# Patient Record
Sex: Female | Born: 1965 | Race: White | Hispanic: No | Marital: Married
Health system: Southern US, Community
[De-identification: ages and names within clinical notes are randomized; demographics above are authoritative.]

## PROBLEM LIST (undated history)

## (undated) DIAGNOSIS — I8000 Phlebitis and thrombophlebitis of superficial vessels of unspecified lower extremity: Secondary | ICD-10-CM

## (undated) DIAGNOSIS — I8390 Asymptomatic varicose veins of unspecified lower extremity: Secondary | ICD-10-CM

## (undated) DIAGNOSIS — N96 Recurrent pregnancy loss: Secondary | ICD-10-CM

## (undated) HISTORY — DX: Phlebitis and thrombophlebitis of superficial vessels of unspecified lower extremity: I80.00

## (undated) HISTORY — DX: Asymptomatic varicose veins of unspecified lower extremity: I83.90

## (undated) HISTORY — DX: Recurrent pregnancy loss: N96

---

## 1998-06-29 ENCOUNTER — Other Ambulatory Visit: Admission: RE | Admit: 1998-06-29 | Discharge: 1998-06-29 | Payer: Self-pay | Admitting: Gynecology

## 2002-07-20 ENCOUNTER — Other Ambulatory Visit: Admission: RE | Admit: 2002-07-20 | Discharge: 2002-07-20 | Payer: Self-pay | Admitting: Gynecology

## 2004-03-22 ENCOUNTER — Ambulatory Visit: Payer: Self-pay | Admitting: Oncology

## 2004-05-25 ENCOUNTER — Ambulatory Visit: Payer: Self-pay | Admitting: Oncology

## 2004-11-29 ENCOUNTER — Other Ambulatory Visit: Admission: RE | Admit: 2004-11-29 | Discharge: 2004-11-29 | Payer: Self-pay | Admitting: Gynecology

## 2004-12-04 ENCOUNTER — Ambulatory Visit (HOSPITAL_COMMUNITY): Admission: RE | Admit: 2004-12-04 | Discharge: 2004-12-04 | Payer: Self-pay | Admitting: Gynecology

## 2004-12-17 ENCOUNTER — Encounter: Admission: RE | Admit: 2004-12-17 | Discharge: 2004-12-17 | Payer: Self-pay | Admitting: Gynecology

## 2006-01-08 ENCOUNTER — Other Ambulatory Visit: Admission: RE | Admit: 2006-01-08 | Discharge: 2006-01-08 | Payer: Self-pay | Admitting: Obstetrics and Gynecology

## 2006-07-02 ENCOUNTER — Inpatient Hospital Stay (HOSPITAL_COMMUNITY): Admission: AD | Admit: 2006-07-02 | Discharge: 2006-07-05 | Payer: Self-pay | Admitting: Obstetrics and Gynecology

## 2007-03-23 ENCOUNTER — Ambulatory Visit (HOSPITAL_COMMUNITY): Admission: RE | Admit: 2007-03-23 | Discharge: 2007-03-23 | Payer: Self-pay | Admitting: Obstetrics and Gynecology

## 2007-05-04 ENCOUNTER — Ambulatory Visit (HOSPITAL_COMMUNITY): Admission: RE | Admit: 2007-05-04 | Discharge: 2007-05-04 | Payer: Self-pay | Admitting: Obstetrics and Gynecology

## 2008-05-30 ENCOUNTER — Ambulatory Visit (HOSPITAL_COMMUNITY): Admission: RE | Admit: 2008-05-30 | Discharge: 2008-05-30 | Payer: Self-pay | Admitting: Obstetrics and Gynecology

## 2009-06-19 ENCOUNTER — Ambulatory Visit (HOSPITAL_COMMUNITY): Admission: RE | Admit: 2009-06-19 | Discharge: 2009-06-19 | Payer: Self-pay | Admitting: Obstetrics and Gynecology

## 2010-06-18 ENCOUNTER — Other Ambulatory Visit (HOSPITAL_COMMUNITY): Payer: Self-pay | Admitting: Obstetrics and Gynecology

## 2010-06-18 DIAGNOSIS — Z1231 Encounter for screening mammogram for malignant neoplasm of breast: Secondary | ICD-10-CM

## 2010-06-27 ENCOUNTER — Ambulatory Visit (HOSPITAL_COMMUNITY)
Admission: RE | Admit: 2010-06-27 | Discharge: 2010-06-27 | Disposition: A | Discharge status: Home / Self Care | Payer: Managed Care, Other (non HMO) | Source: Ambulatory Visit | Attending: Obstetrics and Gynecology | Admitting: Obstetrics and Gynecology

## 2010-06-27 DIAGNOSIS — Z1231 Encounter for screening mammogram for malignant neoplasm of breast: Secondary | ICD-10-CM | POA: Insufficient documentation

## 2010-08-10 NOTE — Discharge Summary (Signed)
[name], [name]               ACCOUNT NO.:  [id number]   MEDICAL RECORD NO.:  [id number]          PATIENT TYPE:  INP   LOCATION:  9111                          FACILITY:  [facility]   PHYSICIAN:  Sherron Monday, MD        DATE OF BIRTH:  04-Nov-1965   DATE OF ADMISSION:  07/02/2006  DATE OF DISCHARGE:  07/05/2006                               DISCHARGE SUMMARY   ADMITTING DIAGNOSIS:  Intrauterine pregnancy at term, active labor.   DISCHARGE DIAGNOSIS:  Intrauterine pregnancy at term, delivered by  spontaneous vaginal delivery.   HISTORY OF PRESENT ILLNESS:  This 45 year old G8, P0, 0, 7, 0 at 39-6/7  weeks with an [facility] of July 04, 2006 by an 11-week ultrasound, presents  to the [facility] in active labor, 5-6 cm dilated.  She was admitted to labor  and delivery and received an epidural. She progressed rapidly to  complete in less than an hour.  She had occasional deep variables with  this rapid progression, otherwise her cervix was very reassuring.  Her  prenatal care was significant for her history of habitual abortion.  History with six previous miscarriages of unknown etiology, so she was  treated with baby ASA thrioughout her pregnancy.  She is also at  advanced maternal age but has had a normal amniocentesis.   PAST MEDICAL HISTORY:  Not significant.   PAST SURGICAL HISTORY:  Significant for D&C x5.   PAST OB/GYN HISTORY:  Rhett Bannister 1 was an ectopic pregnancy in 2002; G2- G7  from 2003 to 2006 were miscarriages. She had D&C x5.   PAST GYN HISTORY:  No recent abnormal Pap smears.   MEDICATIONS:  None.   ALLERGIES:  None.   PRENATAL LABS:  She is A positive, antibody screen negative. RPR  nonreactive. Rubella immune. Hepatitis B surface antigen negative. HIV  nonreactive. Gonorrhea/Chlamydia were negative. Group B strep negative.  One hour GTT of 90 and a normal amniocentesis revealing a 55 XY infant.   After AROM she continued to have deep variable decelerations; pushed for  approximately an hour with supervision. She had a vaginal delivery of a  vigorous female infant, with a first degree laceration.  Apgar's were 9  and 10, the weight is 7 pounds, 5 ounces.  Placenta was delivered  spontaneously and a small periurethral laceration and first degree  perineal were repaired with 3-0 Vicryl in typical fashion.   Her postpartum course was relatively uncomplicated. She remained  afebrile, vital signs were stable throughout. She was discharged home on  postpartum day two with normal lochia and her pain controlled.   She was discharged home with prescriptions for Motrin, Vicodin, and  prenatal vitamins as well as routine discharge instructions. She was  given the numbers to call with any questions or problems.  She will  continue on her baby aspirin she had been taking for the pregnancy.   DISCHARGE INFORMATION:  She is A positive, rubella immune. Her  hemoglobin decreased from 12.8 to 11.0. She plans to breast feed and is  working with the Advertising copywriter.  She is unsure of  her  contraception and this will be discussed at her postpartum check-up.      Sherron Monday, MD  Electronically Signed     JB/MEDQ  D:  07/05/2006  T:  07/05/2006  Job:  [id number]

## 2011-03-25 ENCOUNTER — Ambulatory Visit: Payer: Worker's Compensation

## 2011-04-01 ENCOUNTER — Ambulatory Visit: Payer: Worker's Compensation

## 2011-06-21 ENCOUNTER — Other Ambulatory Visit (HOSPITAL_COMMUNITY): Payer: Self-pay | Admitting: Obstetrics and Gynecology

## 2011-06-21 DIAGNOSIS — Z1231 Encounter for screening mammogram for malignant neoplasm of breast: Secondary | ICD-10-CM

## 2011-07-17 ENCOUNTER — Ambulatory Visit (HOSPITAL_COMMUNITY)
Admission: RE | Admit: 2011-07-17 | Discharge: 2011-07-17 | Disposition: A | Discharge status: Home / Self Care | Payer: Managed Care, Other (non HMO) | Source: Ambulatory Visit | Attending: Obstetrics and Gynecology | Admitting: Obstetrics and Gynecology

## 2011-07-17 DIAGNOSIS — Z1231 Encounter for screening mammogram for malignant neoplasm of breast: Secondary | ICD-10-CM

## 2011-09-16 HISTORY — PX: OTHER SURGICAL HISTORY: SHX169

## 2011-11-11 HISTORY — PX: OTHER SURGICAL HISTORY: SHX169

## 2012-06-23 ENCOUNTER — Other Ambulatory Visit (HOSPITAL_COMMUNITY): Payer: Self-pay | Admitting: Obstetrics and Gynecology

## 2012-06-23 DIAGNOSIS — Z1231 Encounter for screening mammogram for malignant neoplasm of breast: Secondary | ICD-10-CM

## 2012-07-20 ENCOUNTER — Ambulatory Visit (HOSPITAL_COMMUNITY): Payer: Managed Care, Other (non HMO)

## 2012-07-24 ENCOUNTER — Ambulatory Visit (HOSPITAL_COMMUNITY)
Admission: RE | Admit: 2012-07-24 | Discharge: 2012-07-24 | Disposition: A | Discharge status: Home / Self Care | Payer: Managed Care, Other (non HMO) | Source: Ambulatory Visit | Attending: Obstetrics and Gynecology | Admitting: Obstetrics and Gynecology

## 2012-07-24 DIAGNOSIS — Z1231 Encounter for screening mammogram for malignant neoplasm of breast: Secondary | ICD-10-CM

## 2014-03-03 IMAGING — MG MM DIGITAL SCREENING BILAT
5 series · 5 of 5 positions shown · non-contrast
Comparison: Previous exams.

CLINICAL DATA: Screening.

DIGITAL BILATERAL SCREENING MAMMOGRAM WITH CAD

[R CC (1 of 2)]
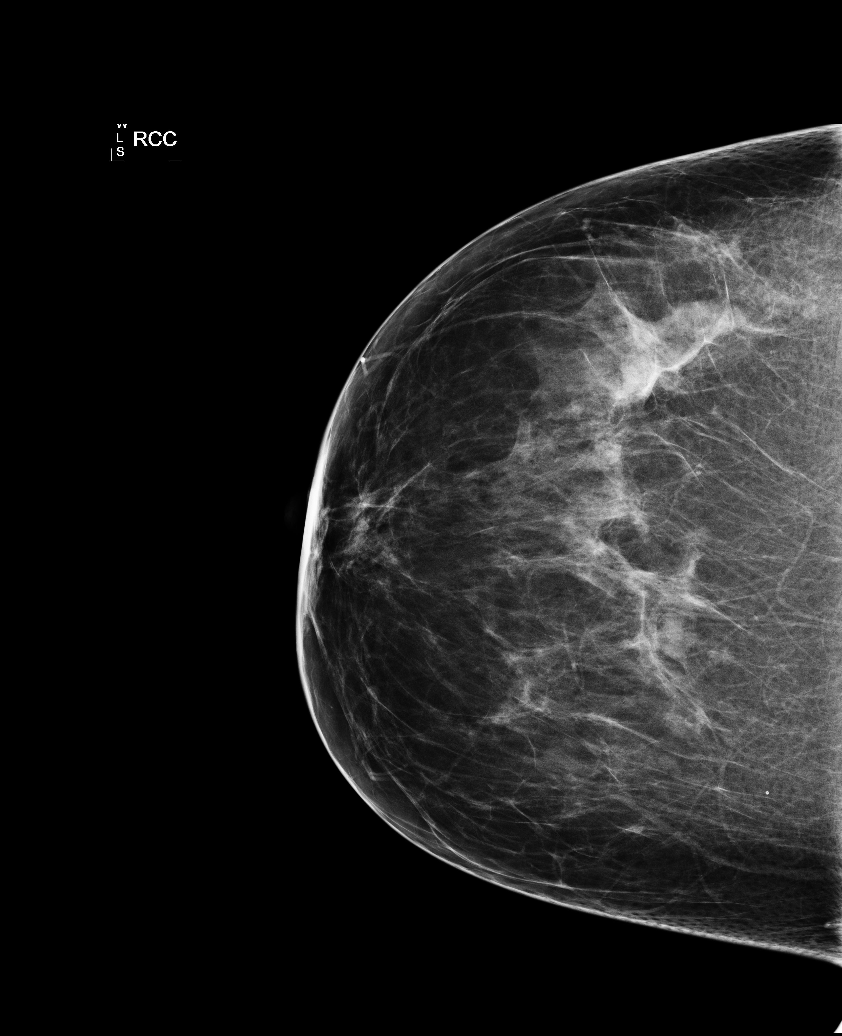

[R MLO]
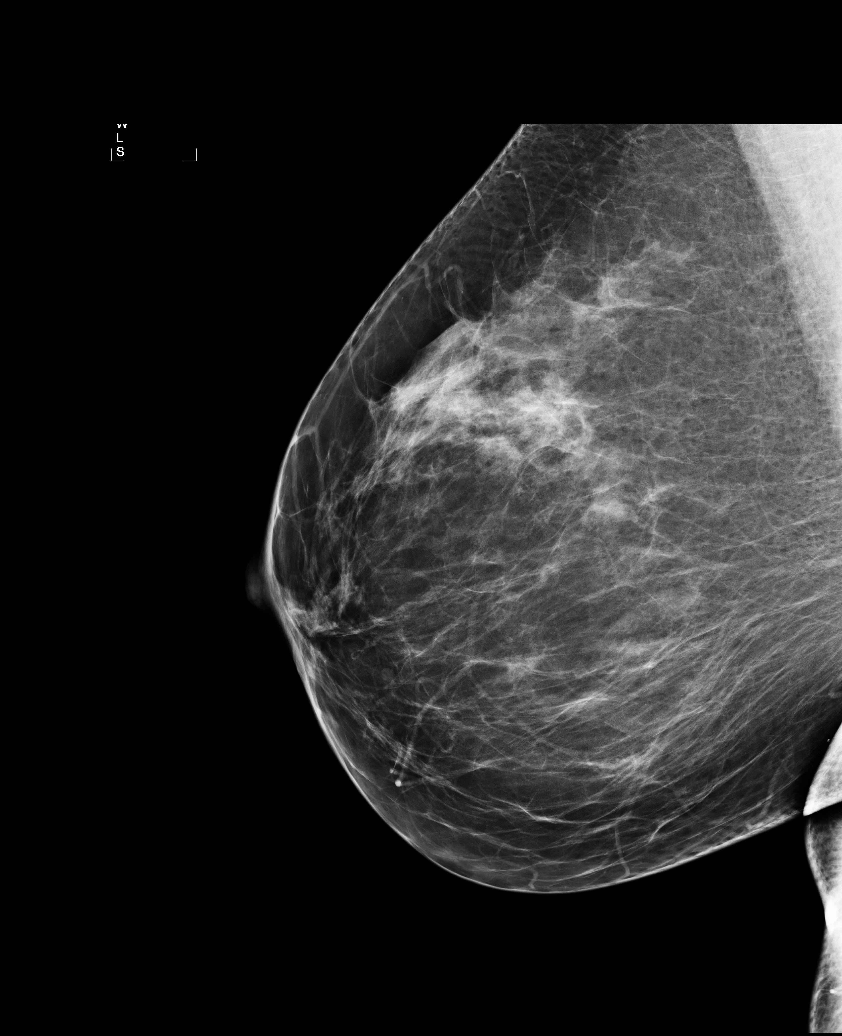

[L CC]
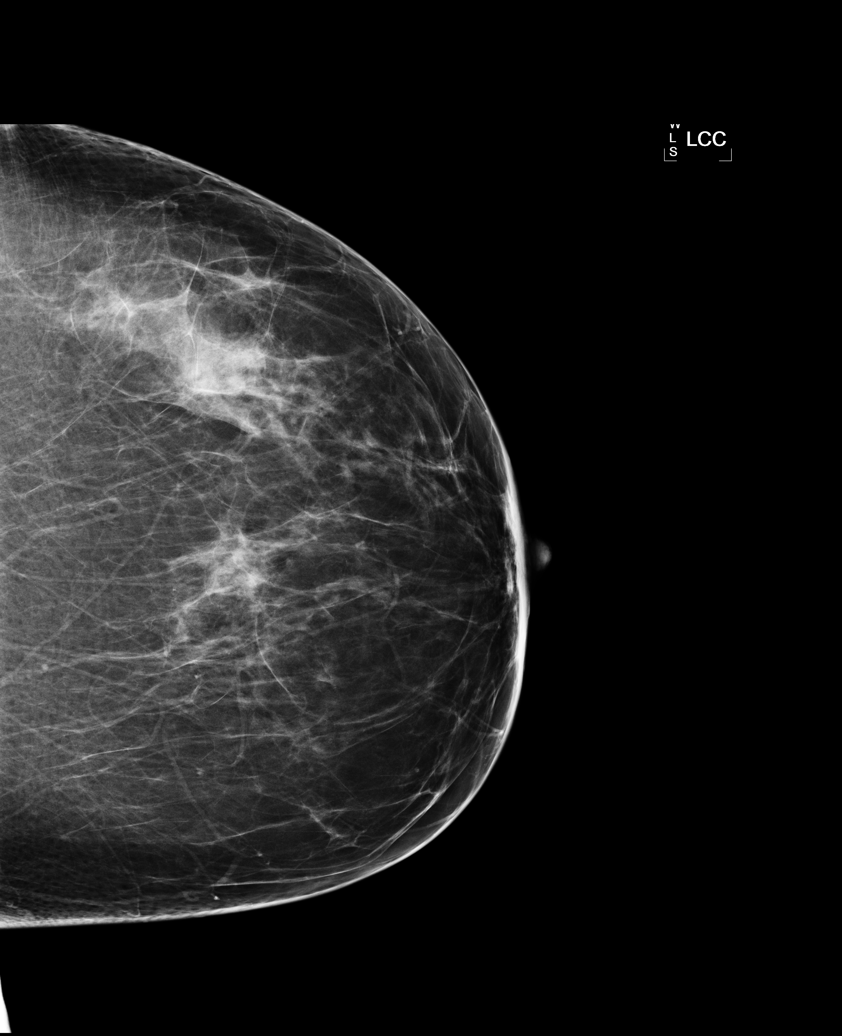

[L MLO]
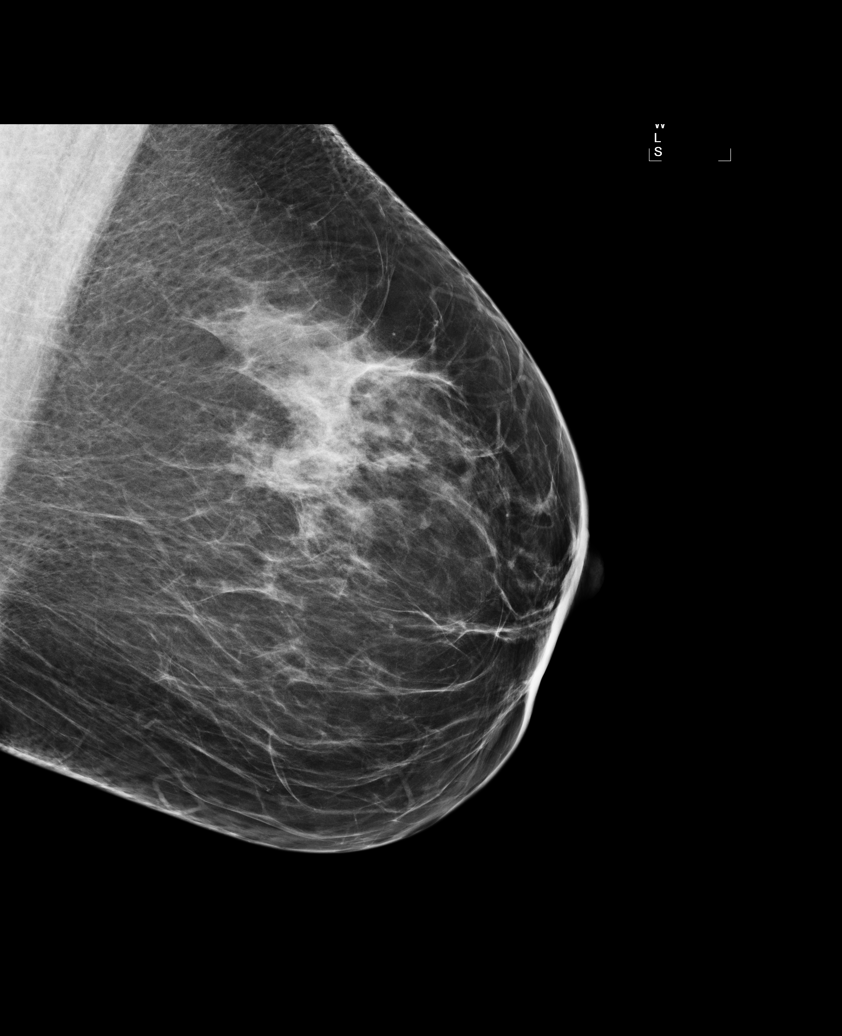

[R CC (2 of 2)]
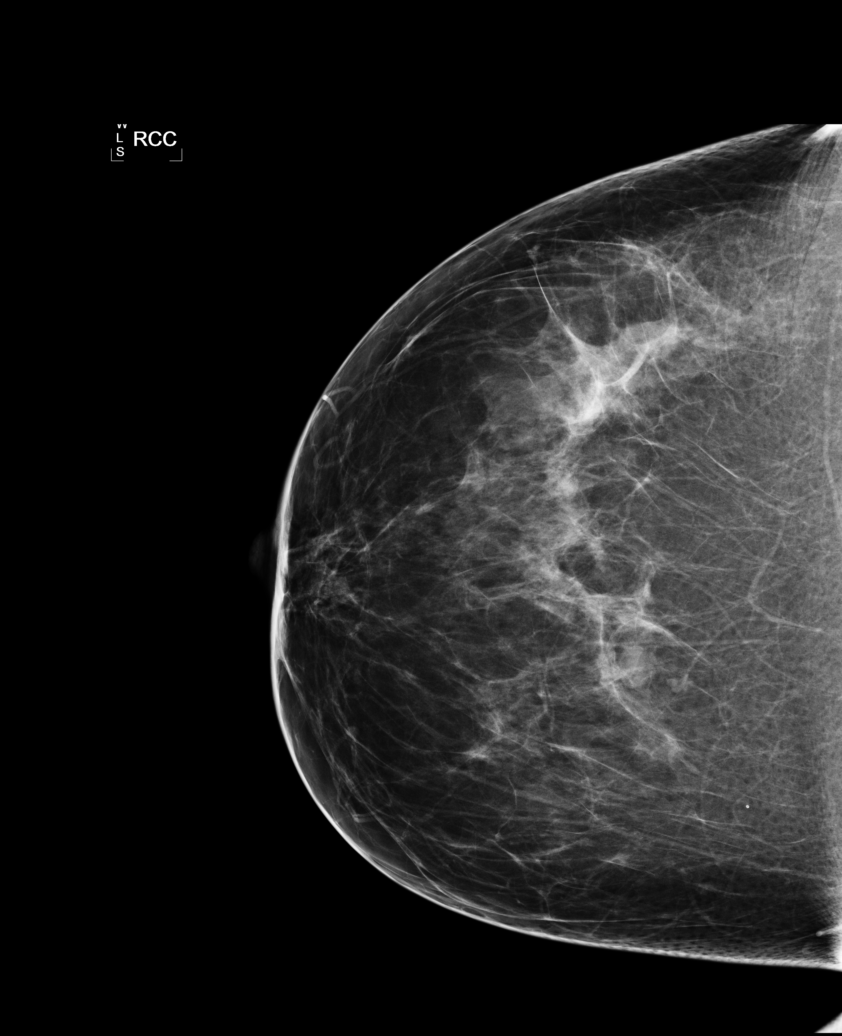

[5 of 5 positions shown; findings below may reference images not displayed]

FINDINGS: ACR Breast Density Category 2: There is a scattered fibroglandular
pattern.

No suspicious masses, architectural distortion, or calcifications
are present.

Images were processed with CAD.
IMPRESSION: No mammographic evidence of malignancy.

A result letter of this screening mammogram will be mailed directly
to the patient.

RECOMMENDATION:
Screening mammogram in one year. (Code:LB-W-669)

BI-RADS CATEGORY 1:  Negative.

## 2015-03-10 ENCOUNTER — Other Ambulatory Visit: Payer: Self-pay | Admitting: *Deleted

## 2015-03-10 ENCOUNTER — Encounter: Payer: Self-pay | Admitting: Vascular Surgery

## 2015-03-10 DIAGNOSIS — I83893 Varicose veins of bilateral lower extremities with other complications: Secondary | ICD-10-CM

## 2015-04-11 ENCOUNTER — Encounter: Payer: Self-pay | Admitting: Vascular Surgery

## 2015-04-18 ENCOUNTER — Ambulatory Visit (INDEPENDENT_AMBULATORY_CARE_PROVIDER_SITE_OTHER): Payer: Managed Care, Other (non HMO) | Admitting: Vascular Surgery

## 2015-04-18 ENCOUNTER — Encounter: Payer: Self-pay | Admitting: Vascular Surgery

## 2015-04-18 ENCOUNTER — Ambulatory Visit (HOSPITAL_COMMUNITY)
Admission: RE | Admit: 2015-04-18 | Discharge: 2015-04-18 | Disposition: A | Discharge status: Home / Self Care | Payer: Managed Care, Other (non HMO) | Source: Ambulatory Visit | Attending: Vascular Surgery | Admitting: Vascular Surgery

## 2015-04-18 VITALS — BP 129/86 | HR 75 | Temp 97.6°F | Resp 16 | Ht 71.0 in | Wt 180.0 lb

## 2015-04-18 DIAGNOSIS — I83893 Varicose veins of bilateral lower extremities with other complications: Secondary | ICD-10-CM | POA: Diagnosis present

## 2015-04-18 DIAGNOSIS — I83892 Varicose veins of left lower extremities with other complications: Secondary | ICD-10-CM | POA: Diagnosis not present

## 2015-04-18 NOTE — Progress Notes (Signed)
Subjective:     Patient ID: [name], female   DOB: [birth year], 50 y.o.   MRN: [id number]  HPI this 50 year old female is self referred for evaluation of painful varicosities in the left leg. She has a previous history of laser ablation of the left great saphenous in the right great saphenous veins as well as sclerotherapy at [facility] in 2013 in 2014. Over the past 6 months or so she has developed progressive enlargement of some varicosities in the left posterior calf area. These are tender. She has no significant distal edema. She has not worn long leg elastic compression stockings recently. She does develop aching and throbbing discomfort as the day progresses. 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 She has no recent history of DVT stasis ulcers or bleeding.  Past Medical History  Diagnosis Date  . Varicose veins   . Thrombophlebitis leg superficial   . History of recurrent miscarriages, not currently pregnant     Social History  Substance Use Topics  . Smoking status: Not on file  . Smokeless tobacco: Not on file  . Alcohol Use: Not on file    Family History  Problem Relation Age of Onset  . Varicose Veins Father   . Varicose Veins Paternal Grandmother     No Known Allergies   Current outpatient prescriptions:  .  escitalopram (LEXAPRO) 20 MG tablet, Take 20 mg by mouth daily., Disp: , Rfl:  .  phentermine (ADIPEX-P) 37.5 MG tablet, Take 37.5 mg by mouth daily before breakfast., Disp: , Rfl:   Filed Vitals:   04/18/15 1228  BP: 129/86  Pulse: 75  Temp: 97.6 F (36.4 C)  Resp: 16  Height:  (1.803 m)  Weight: 180 lb (81.647 kg)  SpO2: 99%    Body mass index is 25.12 kg/(m^2).           Review of Systems denies chest pain, dyspnea on exertion, PND, orthopnea, hemoptysis, claudication     Objective:   Physical Exam BP 129/86 mmHg  Pulse 75  Temp(Src) 97.6 F (36.4 C)  Resp 16  Ht  (1.803 m)  Wt 180 lb  (81.647 kg)  BMI 25.12 kg/m2  SpO2 99%  Gen.-alert and oriented x3 in no apparent distress HEENT normal for age Lungs no rhonchi or wheezing Cardiovascular regular rhythm no murmurs carotid pulses 3+ palpable no bruits audible Abdomen soft nontender no palpable masses Musculoskeletal free of  major deformities Skin clear -no rashes Neurologic normal Lower extremities 3+ femoral and dorsalis pedis pulses palpable bilaterally with no edema Left leg has small varicosities in the lateral posterior aspect of the mid calf extending up to the popliteal fossa. No hyperpigmentation or ulceration is noted.  Today I ordered a venous duplex exam which I reviewed and interpreted. Both great saphenous veins are closed by previous ablation. A right small saphenous vein is not visualized. The left small saphenous vein has a short segment which is patent feeding these bulging varicosities. 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

## 2015-06-14 ENCOUNTER — Encounter: Payer: Self-pay | Admitting: *Deleted

## 2015-06-21 ENCOUNTER — Ambulatory Visit: Payer: Managed Care, Other (non HMO) | Admitting: *Deleted

## 2015-08-04 ENCOUNTER — Encounter: Payer: Self-pay | Admitting: *Deleted

## 2015-08-09 ENCOUNTER — Ambulatory Visit (INDEPENDENT_AMBULATORY_CARE_PROVIDER_SITE_OTHER): Payer: Managed Care, Other (non HMO) | Admitting: *Deleted

## 2015-08-09 DIAGNOSIS — I83892 Varicose veins of left lower extremities with other complications: Secondary | ICD-10-CM

## 2015-08-09 NOTE — Progress Notes (Signed)
X=.4% Sotradecol administered with a 27g butterfly.  Patient received a total of 6cc.  Treated the reticulars in the back of her left leg that are painful. Easy access. Tol well. Anticipate good results. Follow prn.  Photos: No.  Compression stockings applied: Yes.  and an ace wrap to the area.

## 2023-10-23 ENCOUNTER — Other Ambulatory Visit (HOSPITAL_BASED_OUTPATIENT_CLINIC_OR_DEPARTMENT_OTHER): Payer: Self-pay | Admitting: Physician Assistant

## 2023-10-23 DIAGNOSIS — E7800 Pure hypercholesterolemia, unspecified: Secondary | ICD-10-CM

## 2023-11-11 ENCOUNTER — Other Ambulatory Visit (HOSPITAL_BASED_OUTPATIENT_CLINIC_OR_DEPARTMENT_OTHER)

## 2023-11-25 ENCOUNTER — Ambulatory Visit (HOSPITAL_BASED_OUTPATIENT_CLINIC_OR_DEPARTMENT_OTHER)
Admission: RE | Admit: 2023-11-25 | Discharge: 2023-11-25 | Disposition: A | Discharge status: Home / Self Care | Payer: Self-pay | Source: Ambulatory Visit | Attending: Physician Assistant | Admitting: Physician Assistant

## 2023-11-25 DIAGNOSIS — E7800 Pure hypercholesterolemia, unspecified: Secondary | ICD-10-CM | POA: Insufficient documentation
